# Patient Record
Sex: Female | Born: 1985 | Hispanic: No | Marital: Married
Health system: Southern US, Community
[De-identification: ages and names within clinical notes are randomized; demographics above are authoritative.]

---

## 2014-06-17 ENCOUNTER — Other Ambulatory Visit: Payer: Self-pay

## 2014-06-21 ENCOUNTER — Other Ambulatory Visit (HOSPITAL_COMMUNITY): Payer: Self-pay | Admitting: Obstetrics and Gynecology

## 2014-06-21 DIAGNOSIS — O365930 Maternal care for other known or suspected poor fetal growth, third trimester, not applicable or unspecified: Secondary | ICD-10-CM

## 2014-06-24 ENCOUNTER — Ambulatory Visit (HOSPITAL_COMMUNITY)
Admission: RE | Admit: 2014-06-24 | Discharge: 2014-06-24 | Disposition: A | Discharge status: Home / Self Care | Payer: Managed Care, Other (non HMO) | Source: Ambulatory Visit | Attending: Obstetrics and Gynecology | Admitting: Obstetrics and Gynecology

## 2014-06-24 ENCOUNTER — Other Ambulatory Visit (HOSPITAL_COMMUNITY): Payer: Self-pay | Admitting: Obstetrics and Gynecology

## 2014-06-24 ENCOUNTER — Encounter (HOSPITAL_COMMUNITY): Payer: Self-pay

## 2014-06-24 DIAGNOSIS — K831 Obstruction of bile duct: Secondary | ICD-10-CM

## 2014-06-24 DIAGNOSIS — O365930 Maternal care for other known or suspected poor fetal growth, third trimester, not applicable or unspecified: Secondary | ICD-10-CM

## 2014-06-24 DIAGNOSIS — O26619 Liver and biliary tract disorders in pregnancy, unspecified trimester: Secondary | ICD-10-CM | POA: Insufficient documentation

## 2014-06-24 DIAGNOSIS — O365991 Maternal care for other known or suspected poor fetal growth, unspecified trimester, fetus 1: Secondary | ICD-10-CM

## 2014-06-24 DIAGNOSIS — K838 Other specified diseases of biliary tract: Secondary | ICD-10-CM | POA: Insufficient documentation

## 2014-06-24 DIAGNOSIS — O26613 Liver and biliary tract disorders in pregnancy, third trimester: Principal | ICD-10-CM

## 2014-06-24 DIAGNOSIS — O365990 Maternal care for other known or suspected poor fetal growth, unspecified trimester, not applicable or unspecified: Secondary | ICD-10-CM | POA: Insufficient documentation

## 2014-06-24 MED ORDER — URSODIOL 300 MG PO CAPS: 300.0000 mg | ORAL_CAPSULE | Freq: Two times a day (BID) | ORAL | Status: AC

## 2014-06-24 NOTE — Consult Note (Signed)
MFM Consultation, Staff Note:  Impressions: SIUP at 1062w0d in gestation complicated by cholestasis of pregnancy EFW 45th% No structural defects noting the right umbilical artery appears tortuous just anterior to the bladder, which is likely representative of a normal variation anatomically and has no known clinical significance. There is no umbilical vein varix Limitations to survey as documented above UA Dopplers are normal for assigned gestational age No previa BPP 10/10  Discussion: Intrahepatic cholestasis of pregnancy (ICP) affects 0.7% of white pregnant women, approximately twice as many [place] women, and up to 5% of [place] women. The recurrence rate of ICP varies from 60% to 90% in different populations.  Fetal complications that occur more commonly in ICP pregnancies include preterm labor, fetal asphyxial events, meconium staining of amniotic fluid, and intrauterine death. Three studies have demonstrated that ICP patients with higher maternal serum bile acid levels (>40 mol/L in two studies) more commonly have pregnancies complicated by meconium-stained liquor and fetal asphyxial events, and the largest study also demonstrated that patients with higher levels of bile acids had higher rates of spontaneous preterm labor.  ICP should be also in pregnant women with pruritus but without a rash. The pruritus is commonly generalized or affects the palms and soles, but it can occur on any part of the body. There is no consensus about the most reliable biochemical test for diagnosing ICP. Periotic measuring levels of liver transaminases with serum bile acids is recommended. Although ICP often identified during late pregnancy, early recurrent has also been reported. So serum bile acid test is recommended and Ms. Katrinka BlazingSmith wants go back to her primary OB office to have the test done. Cholestasis may also occur in conjunction with other liver diseases. It is advisable to perform a liver ultrasound  scan to exclude biliary obstruction. Affected women commonly have gallstones, however, the gallstones are unlikely to be the cause of the cholestasis unless the woman has symptoms of biliary obstruction. Other conditions that can be associated with ICP are hepatitis C, autoimmune hepatitis, and primary biliary cirrhosis. These conditions have important implications for the subsequent health of the mother, and it is therefore advisable to screen for them. Ursodeoxycholic acid (UDCA) is the only drug that has consistently been shown to improve the maternal symptoms and biochemical features of ICP. There have been several reports about the efficacy of UDCA in ICP, but there have been few randomized, controlled trials. The largest trial showed that UDCA reduced levels of pruritus, liver transaminases, and bilirubin compared with dexamethasone or placebo and that it was particularly effective in women with serum levels of bile acids higher than 40 mol/L.  UDCA is usually started at a dose of 500 mg twice daily, and the dose may be increased further.  A variety of other drugs have been proposed as treatments for ICP, including dexamethasone, S-adenosyl methionine, cholestyramine, and guar gum, but there is less evidence for their efficacy than there is for UDCA.  It is advisable to give affected women vitamin K because of the theoretical risk of hemorrhage in association with ICP.  No treatments have been shown to reduce fetal risks associated with ICP. However, it is likely that treatments that reduce levels of maternal bile acids also reduce fetal risk because of the data that implicate bile acids in pregnancies complicated by spontaneous preterm delivery, fetal asphyxial events, and meconium-stained amniotic fluid. None of the UDCA trials has been powered to investigate whether the drug protects the fetus. However, it is known that UDCA treatment  improves the serum bile acid levels measured in cord blood and  amniotic fluid at the time of delivery.   The only forms of fetal surveillance that have been shown to predict which fetuses may be at risk are amniocentesis and amnioscopy for meconium. However, such an approach is likely to be considered too intrusive to be used routinely by most obstetricians. Many obstetric units review women with ICP several times per week for fetal assessment by electronic fetal monitoring and/or biophysical profile, or both.  Surveillance Recommendations: 1. twice weekly NSTs 2. weekly AFI 3. interval growth monthly 4. see MFM consultation  Your patient asked to have the aforementioned appointments in our unit, and these were made in our unit at her request.  Management reccommendations in brief: 1. ursodiol 300mg  po bid was prescribed for cholestasis 2. recommend delivery by 37 weeks.  Time Spent: I spent in excess of 40 minutes in consultation with this patient to review records, evaluate her case, and provide her with an adequate discussion and education.  More than 50% of this time was spent in direct face-to-face counseling. It was a pleasure seeing your patient in the office today.  Thank you for consultation. Please do not hesitate to contact our service for any further questions.   Thank you,  Louann SjogrenJeffrey Morgan Gaynelle Arabianenney   Denney, Louann SjogrenJeffrey Morgan, MD, MS, FACOG Assistant Professor Section of Maternal-Fetal Medicine [facility]

## 2014-06-28 ENCOUNTER — Ambulatory Visit (HOSPITAL_COMMUNITY)
Admission: RE | Admit: 2014-06-28 | Discharge: 2014-06-28 | Disposition: A | Discharge status: Home / Self Care | Payer: Managed Care, Other (non HMO) | Source: Ambulatory Visit | Attending: Obstetrics and Gynecology | Admitting: Obstetrics and Gynecology

## 2014-06-28 ENCOUNTER — Encounter (HOSPITAL_COMMUNITY): Payer: Self-pay

## 2014-06-28 DIAGNOSIS — K838 Other specified diseases of biliary tract: Secondary | ICD-10-CM | POA: Insufficient documentation

## 2014-06-28 DIAGNOSIS — O365991 Maternal care for other known or suspected poor fetal growth, unspecified trimester, fetus 1: Secondary | ICD-10-CM

## 2014-06-28 DIAGNOSIS — Z3689 Encounter for other specified antenatal screening: Secondary | ICD-10-CM | POA: Insufficient documentation

## 2014-06-28 DIAGNOSIS — O26619 Liver and biliary tract disorders in pregnancy, unspecified trimester: Secondary | ICD-10-CM | POA: Insufficient documentation

## 2014-06-28 NOTE — Progress Notes (Signed)
[facility] ultrasound  Indication: 28 yr old G1P0 at 5123w4d with cholestasis for BPP and complete anatomy.  Findings: 1. Single intrauterine pregnancy. 2. Posterior placenta without evidence of previa. 3. Normal amniotic fluid index. 4. The views of the ductal arch remain limited. 5. The remainder of the limited anatomy survey is normal. 6. Normal biophysical profile of 8/8.  Recommendations: 1. Cholestasis: - previously counseled - on ursodiol - recommend continue antenatal testing; start twice weekly NSTs with weekly AFI next week - recommend fetal kick counts - recommend fetal growth in 3 weeks - recommend delivery at 36-37 weeks or sooner if clinically indicated 2. View of ductal arch continues to be limited: - reattempt on follow up ultrasound  Eulis FosterKristen Quinn, MD

## 2014-07-05 ENCOUNTER — Ambulatory Visit (HOSPITAL_COMMUNITY): Payer: Managed Care, Other (non HMO)

## 2014-07-08 ENCOUNTER — Ambulatory Visit (HOSPITAL_COMMUNITY): Payer: Managed Care, Other (non HMO)

## 2014-07-12 ENCOUNTER — Ambulatory Visit (HOSPITAL_COMMUNITY): Payer: Managed Care, Other (non HMO)

## 2014-07-15 ENCOUNTER — Ambulatory Visit (HOSPITAL_COMMUNITY): Payer: Managed Care, Other (non HMO)

## 2014-07-15 ENCOUNTER — Other Ambulatory Visit (HOSPITAL_COMMUNITY): Payer: Managed Care, Other (non HMO)

## 2014-09-18 IMAGING — US US FETAL BPP W/O NONSTRESS
1 series · 13 of 27 positions shown · non-contrast
Comparison: none

OBSTETRICS REPORT

Service(s) Provided
Indications
 Medical complication of pregnancy (Cholestasis)
Fetal Evaluation
 Num Of Fetuses:    1
 Fetal Heart Rate:  139                          bpm
 Cardiac Activity:  Observed
 Presentation:      Cephalic
 Placenta:          Posterior, above cervical
                    os
 P. Cord            Previously Visualized
 Insertion:
 Amniotic Fluid
 AFI FV:      Subjectively within normal limits
 AFI Sum:     15.64   cm       56  %Tile     Larg Pckt:     4.6  cm
 RUQ:   4.6     cm   RLQ:    3.65   cm    LUQ:   3.9     cm   LLQ:    3.49   cm
Biophysical Evaluation
 Amniotic F.V:   Within normal limits       F. Tone:        Observed
 F. Movement:    Observed                   Score:          [DATE]
 F. Breathing:   Observed
Gestational Age
 Best:          32w 4d     Det. By:  Early Ultrasound         EDD:   08/19/14
Anatomy
 Aortic Arch:      Appears normal         Abdominal Wall:   Appears nml (cord
                                                            insert, abd wall)
 Ductal Arch:      Not well visualized    Kidneys:          Appear normal
 Stomach:          Appears normal, left   Bladder:          Appears normal
                   sided
Impression
INDICATION: 28 yr old G1P0 at 53w6d with cholestasis for BPP
 and complete anatomy.

[Series 1: us fetal bpp w/o nonstress · 0.22mm/px · 13 of 27 slices shown]
[im 2/27]
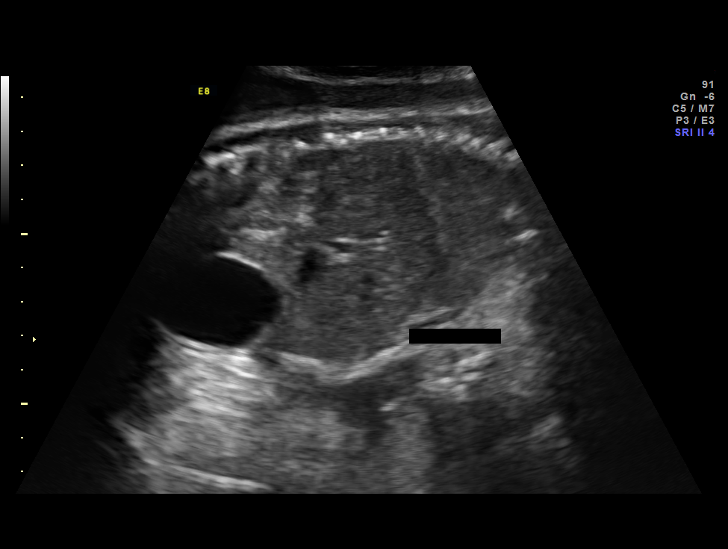
[im 4/27]
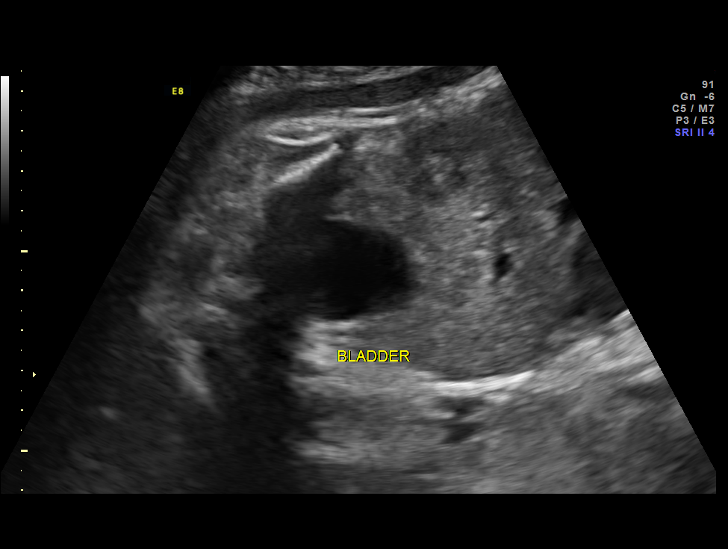
[im 6/27]
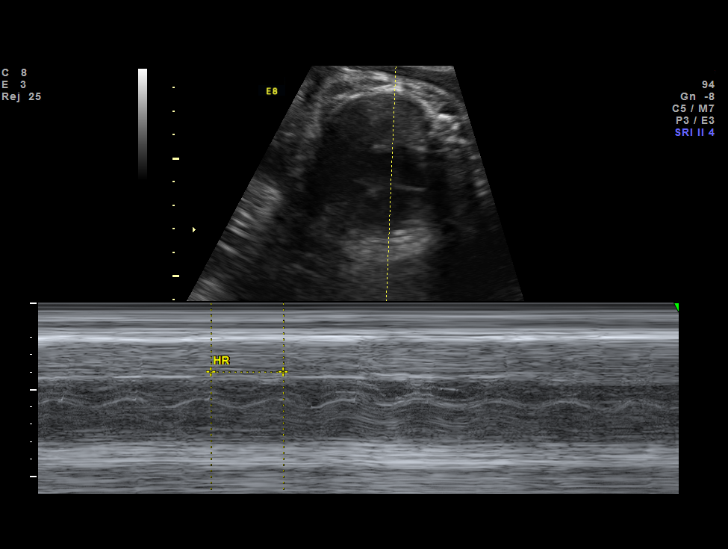
[im 8/27]
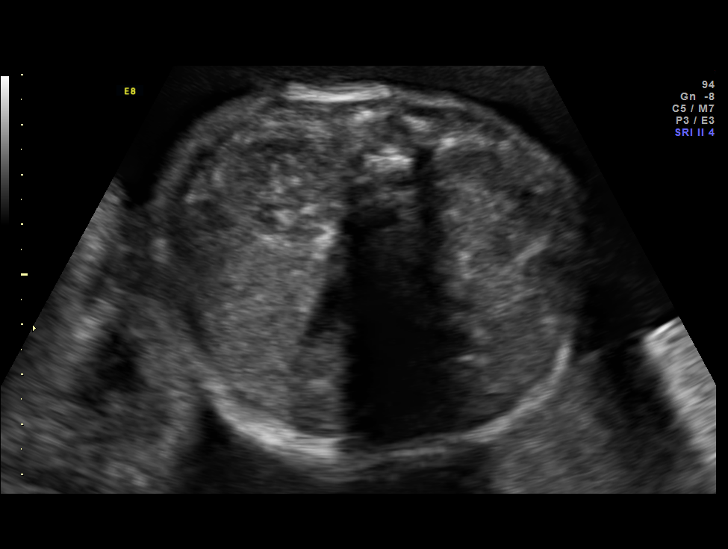
[im 10/27]
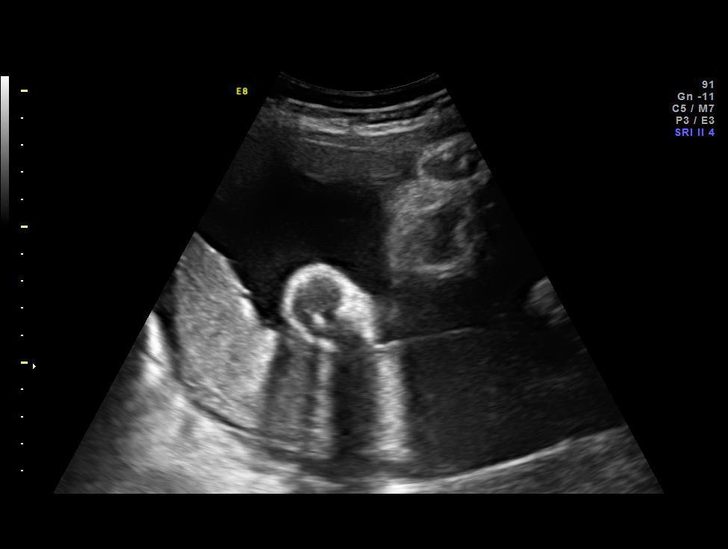
[im 12/27]
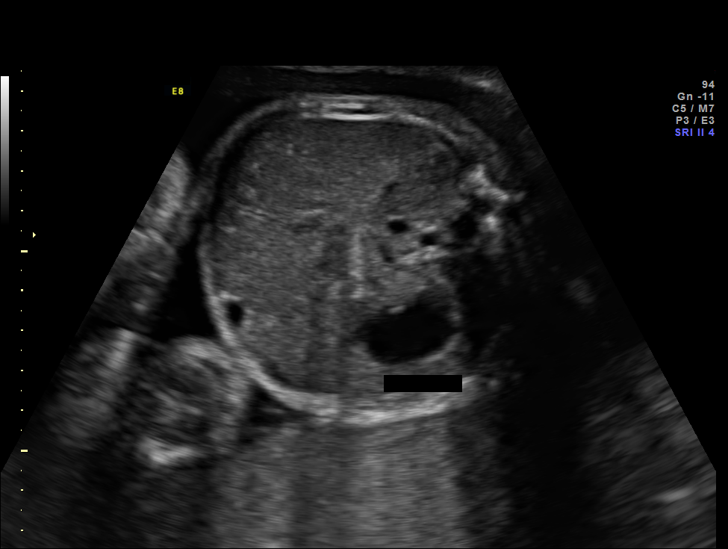
[im 14/27]
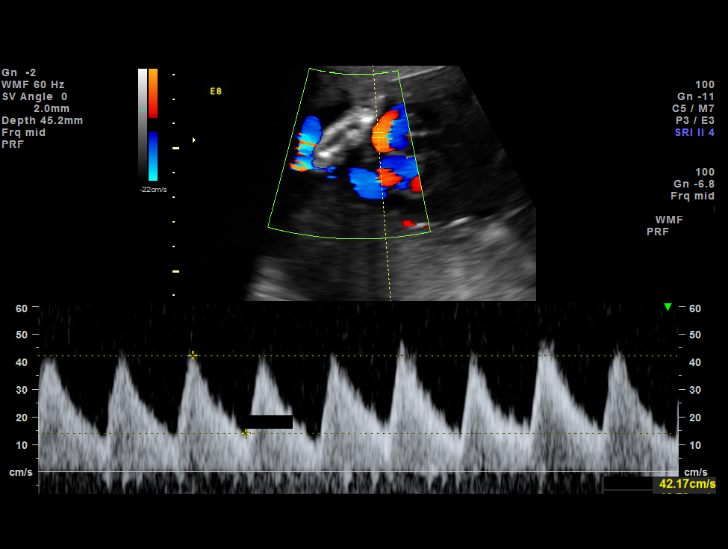
[im 16/27]
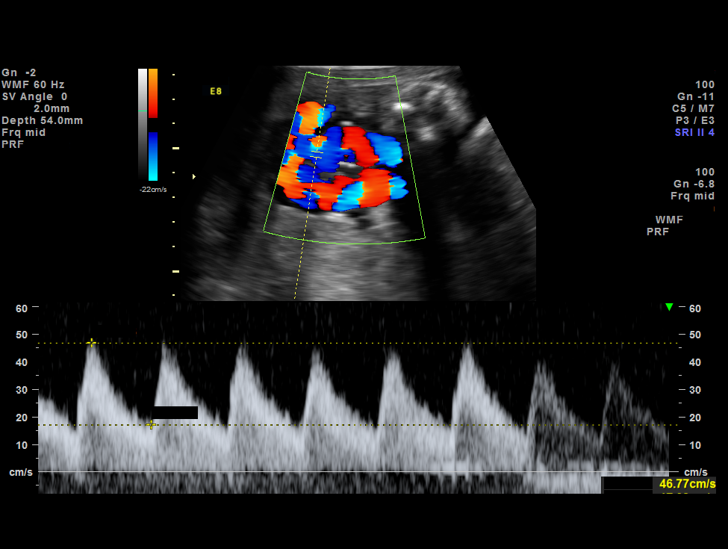
[im 18/27]
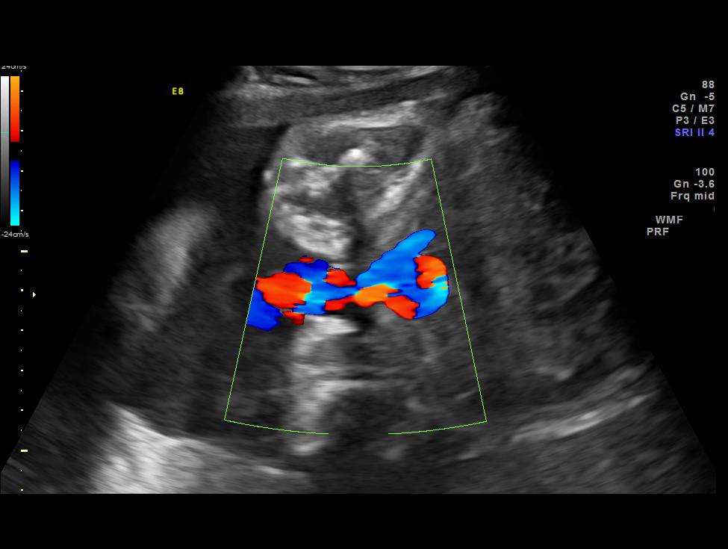
[im 20/27]
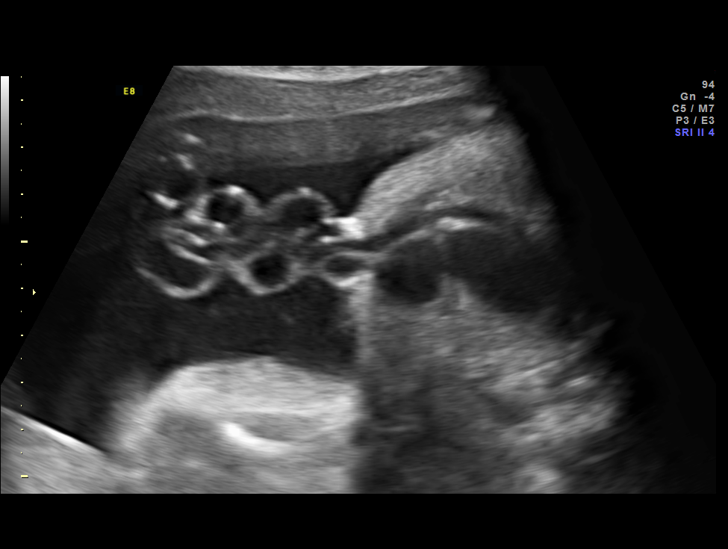
[im 22/27]
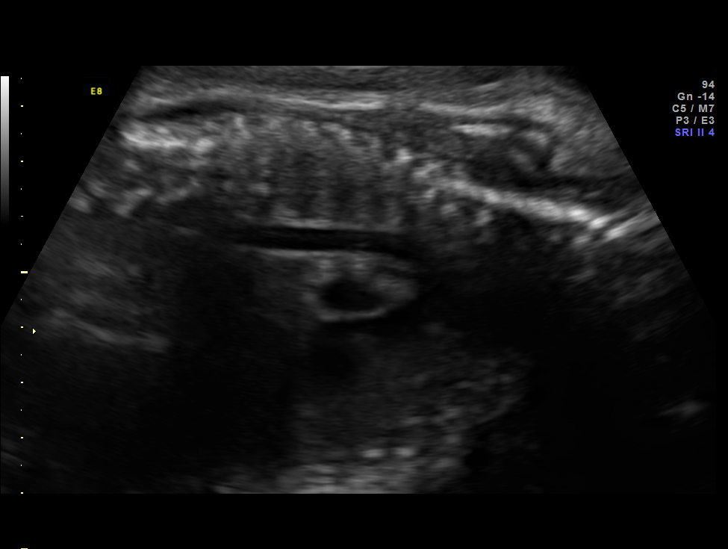
[im 24/27]
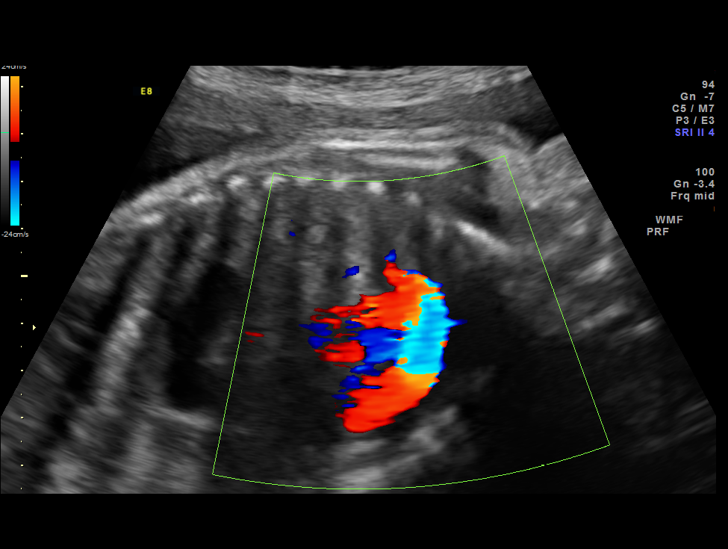
[im 26/27]
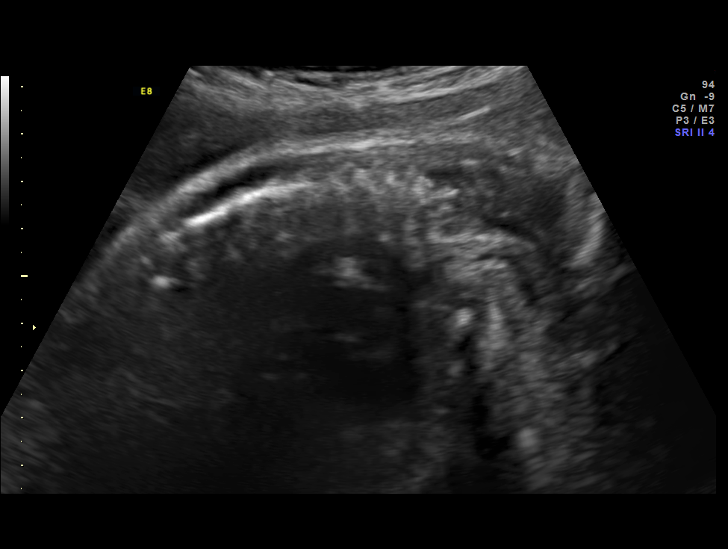

[13 of 27 positions shown; findings below may reference images not displayed]

FINDINGS: 1. Single intrauterine pregnancy.
 2. Posterior placenta without evidence of previa.
 3. Normal amniotic fluid index.
 4. The views of the ductal arch remain limited.
 5. The remainder of the limited anatomy survey is normal.
 6. Normal biophysical profile of [DATE].
Recommendations

 1. Cholestasis:
 - previously counseled
 - on ursodiol
 - recommend continue antenatal testing; start twice weekly
 NSTs with weekly AFI next week
 - recommend fetal Eilleg Nagap Aicedreb
 - recommend fetal growth in 3 weeks
 - recommend delivery at 36-37 weeks or sooner if clinically
 indicated
 2. View of ductal arch continues to be limited:
 - reattempt on follow up ultrasound

                 Attending Physician, EDDIER

## 2014-11-01 ENCOUNTER — Encounter (HOSPITAL_COMMUNITY): Payer: Self-pay

## 2015-04-29 ENCOUNTER — Encounter (HOSPITAL_COMMUNITY): Payer: Self-pay | Admitting: *Deleted
